# Patient Record
Sex: Female | Born: 1985 | Race: White | Hispanic: No | Marital: Single | Smoking: Never smoker
Health system: Southern US, Community
[De-identification: ages and names within clinical notes are randomized; demographics above are authoritative.]

## PROBLEM LIST (undated history)

## (undated) HISTORY — PX: NO PAST SURGERIES: SHX2092

---

## 2009-01-22 ENCOUNTER — Observation Stay: Payer: Self-pay | Admitting: Obstetrics and Gynecology

## 2009-01-23 ENCOUNTER — Observation Stay: Payer: Self-pay | Admitting: Obstetrics and Gynecology

## 2009-01-24 ENCOUNTER — Observation Stay: Payer: Self-pay | Admitting: Obstetrics and Gynecology

## 2009-01-24 ENCOUNTER — Inpatient Hospital Stay: Payer: Self-pay | Admitting: Obstetrics and Gynecology

## 2014-10-11 ENCOUNTER — Ambulatory Visit: Payer: Self-pay

## 2016-08-15 IMAGING — US US BREAST*R* LIMITED INC AXILLA
1 series · 5 of 5 positions shown · non-contrast
Comparison: None

CLINICAL DATA: Patient presents with palpable nodules in both
breasts, [DATE] to [DATE] on the right and [DATE] to [DATE] on the left.

EXAM:
ULTRASOUND OF THE BILATERAL BREAST

[Series 1: us breast*right* limited inc axilla · 0.08mm/px · 5 of 5 slices shown]
[im 1/5]
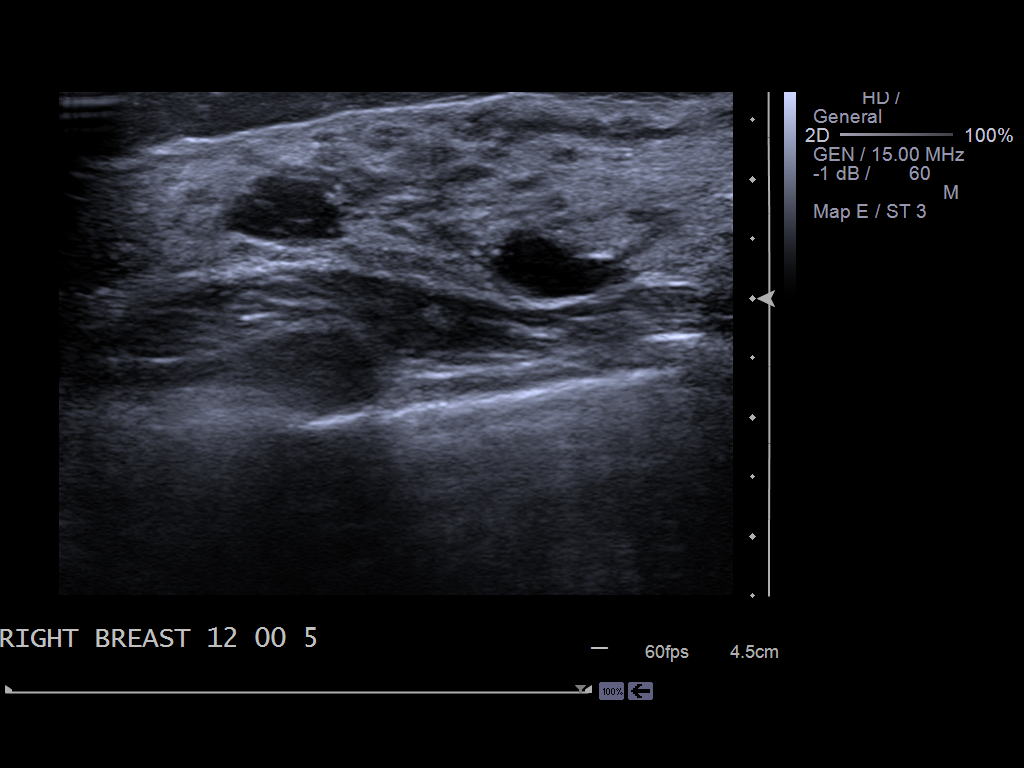
[im 2/5]
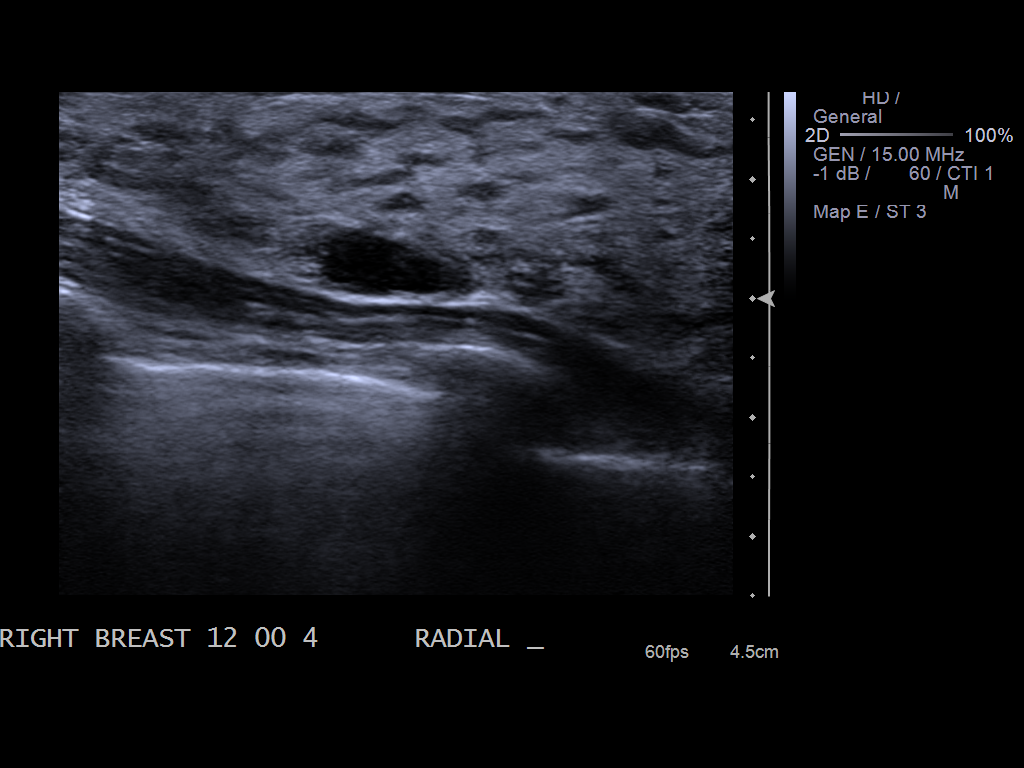
[im 3/5]
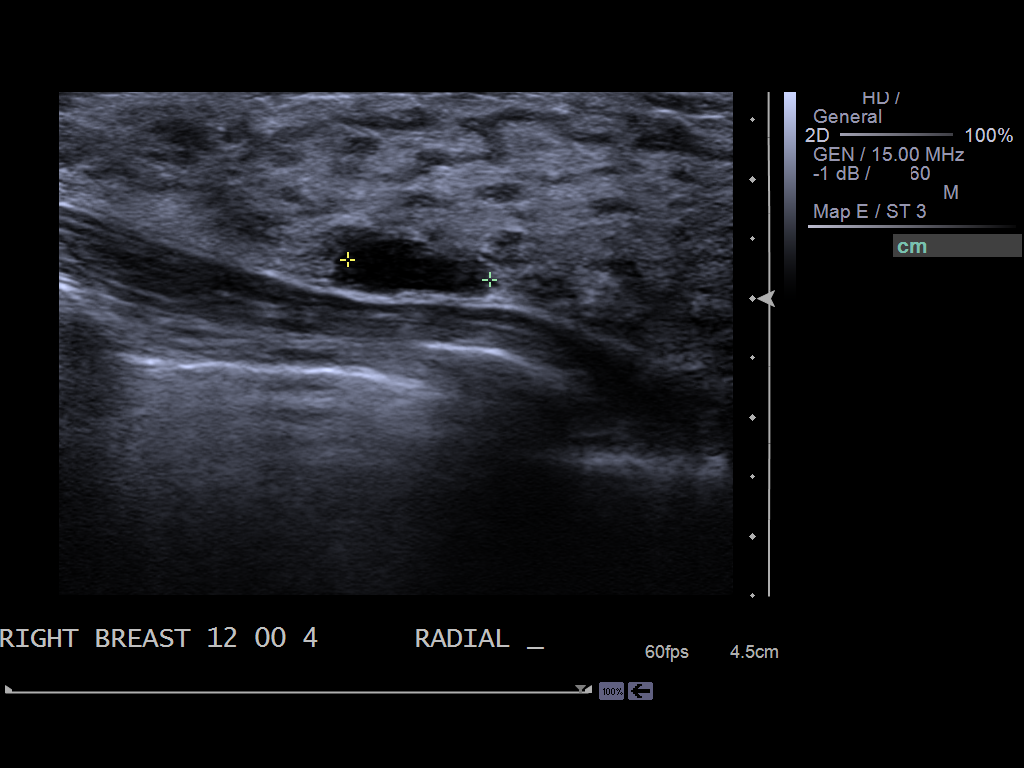
[im 4/5]
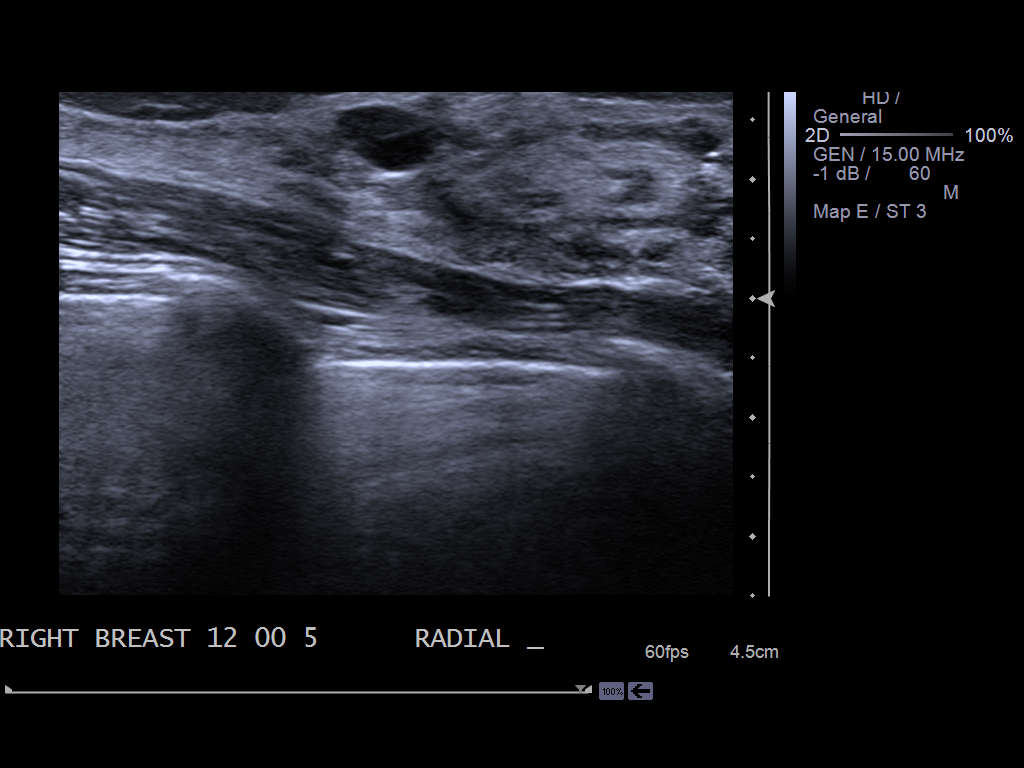
[im 5/5]
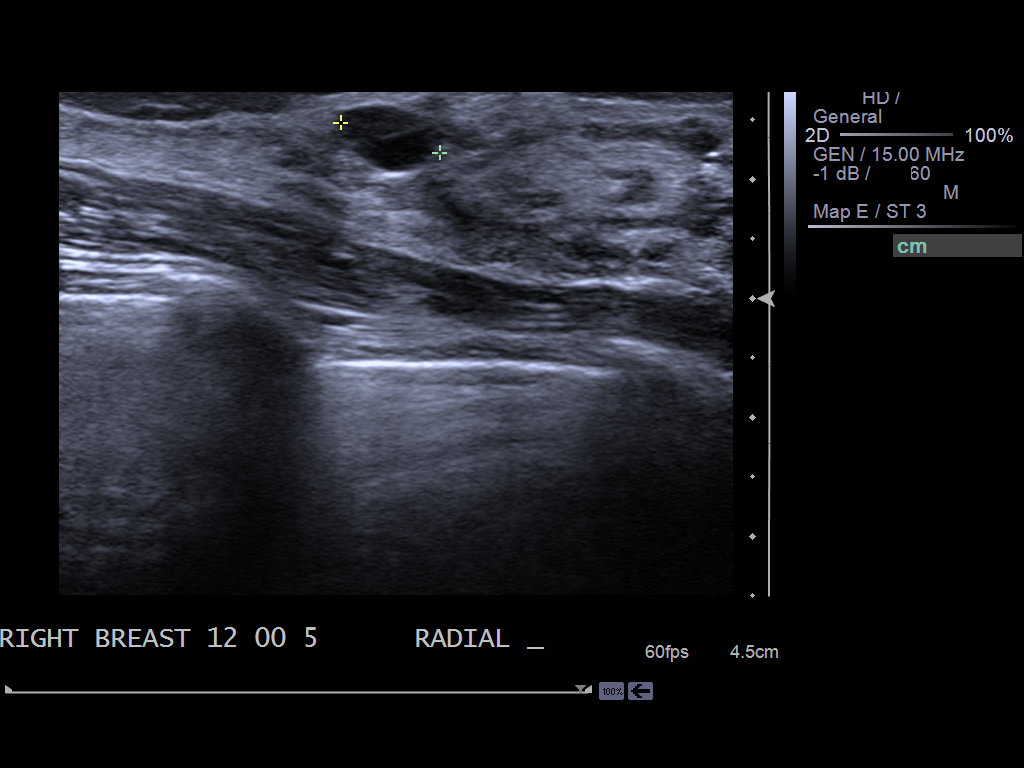

[5 of 5 positions shown; findings below may reference images not displayed]

FINDINGS: On physical exam, there is generalized nodularity of the tissue
throughout the upper aspect of both breasts, more prominent on the
right. No discrete mass is palpated, however.

Targeted ultrasound is performed, showing several small cysts in
each breast. On the right, cysts are larger, with the 2 largest
measuring 1.2 cm and 0.9 cm in greatest dimensions from both in the
12 o'clock position. Two smaller cysts are noted in the [DATE]
position in the left breast. There are no solid masses any the side.
There are no suspicious areas of shadowing.
IMPRESSION: Benign bilateral breast cysts. No solid masses and no evidence of
malignancy.

RECOMMENDATION:
Screening mammogram at age 42 unless there are persistent or
intervening clinical concerns. (Code:[id number]-F-82W)

I have discussed the findings and recommendations with the patient.
Results were also provided in writing at the conclusion of the
visit. If applicable, a reminder letter will be sent to the patient
regarding the next appointment.

BI-RADS CATEGORY  2: Benign Finding(s)

## 2016-08-15 IMAGING — US US BREAST*L* LIMITED INC AXILLA
1 series · 1 of 1 positions shown · non-contrast
Comparison: None

CLINICAL DATA: Patient presents with palpable nodules in both
breasts, [DATE] to [DATE] on the right and [DATE] to [DATE] on the left.

EXAM:
ULTRASOUND OF THE BILATERAL BREAST

[Series 1: us breast*left* limited inc axilla · 0.08mm/px · 1 of 1 slices shown]
[im 1/1]
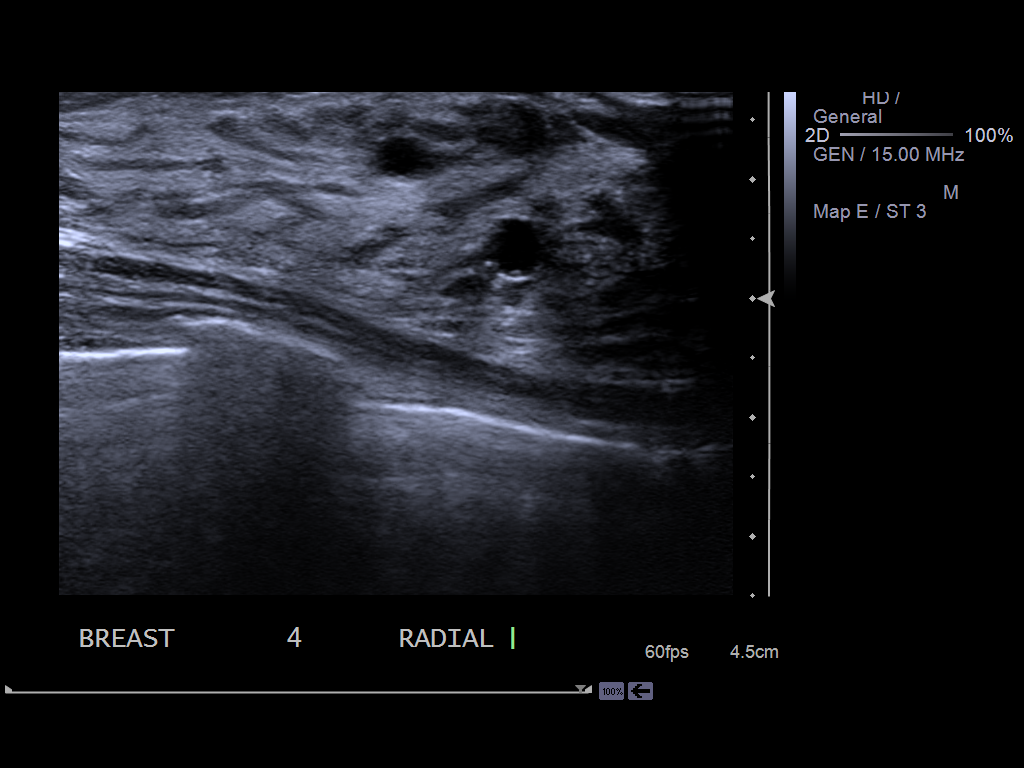

[1 of 1 positions shown; findings below may reference images not displayed]

FINDINGS: On physical exam, there is generalized nodularity of the tissue
throughout the upper aspect of both breasts, more prominent on the
right. No discrete mass is palpated, however.

Targeted ultrasound is performed, showing several small cysts in
each breast. On the right, cysts are larger, with the 2 largest
measuring 1.2 cm and 0.9 cm in greatest dimensions from both in the
12 o'clock position. Two smaller cysts are noted in the [DATE]
position in the left breast. There are no solid masses any the side.
There are no suspicious areas of shadowing.
IMPRESSION: Benign bilateral breast cysts. No solid masses and no evidence of
malignancy.

RECOMMENDATION:
Screening mammogram at age 42 unless there are persistent or
intervening clinical concerns. (Code:W2-F-82W)

I have discussed the findings and recommendations with the patient.
Results were also provided in writing at the conclusion of the
visit. If applicable, a reminder letter will be sent to the patient
regarding the next appointment.

BI-RADS CATEGORY  2: Benign Finding(s)

## 2018-03-14 ENCOUNTER — Encounter: Payer: Self-pay | Admitting: Emergency Medicine

## 2018-03-14 ENCOUNTER — Ambulatory Visit
Admission: EM | Admit: 2018-03-14 | Discharge: 2018-03-14 | Disposition: A | Discharge status: Home / Self Care | Payer: BLUE CROSS/BLUE SHIELD | Attending: Family Medicine | Admitting: Family Medicine

## 2018-03-14 ENCOUNTER — Other Ambulatory Visit: Payer: Self-pay

## 2018-03-14 DIAGNOSIS — R51 Headache: Secondary | ICD-10-CM | POA: Diagnosis not present

## 2018-03-14 DIAGNOSIS — G500 Trigeminal neuralgia: Secondary | ICD-10-CM | POA: Diagnosis not present

## 2018-03-14 DIAGNOSIS — R519 Headache, unspecified: Secondary | ICD-10-CM

## 2018-03-14 DIAGNOSIS — G501 Atypical facial pain: Secondary | ICD-10-CM | POA: Diagnosis not present

## 2018-03-14 MED ORDER — BACLOFEN 5 MG PO TABS: 10.0000 mg | ORAL_TABLET | Freq: Three times a day (TID) | ORAL | 0 refills | Status: DC

## 2018-03-14 MED ORDER — BACLOFEN 5 MG PO TABS: 5.0000 mg | ORAL_TABLET | Freq: Three times a day (TID) | ORAL | 0 refills | Status: DC

## 2018-03-14 NOTE — ED Triage Notes (Signed)
Pt here c/o facial pain on her right side for several months. She thought it was a cavity and went to the dentist. determined to not be a cavity then she went to the orthodontist and was told that this sounded like her sinuses. She has been having sharp pain/pressure in her head and face that last a few seconds and go away. No sinus congestion. denies fevers, chills.

## 2018-03-14 NOTE — ED Provider Notes (Signed)
[facility]    CSN: [id number] Arrival date & time: 03/14/18  1718     History   Chief Complaint Chief Complaint  Patient presents with  . Facial Pain    HPI Heidi Ramos is a 32 y.o. female.   HPI  32 year old female presents with right-sided facial pain that she has had for several months.  She thought it may be a cavity and she went to the dentist.  Dentist a thorough exam finding she had no cavities.  He sent her on to an orthodontist and she told her" more like a sinus problem."  Having sharp pain and pressure in her head and face that lasts a few seconds is very intense and then will go away.  Says any cold food or beverage that she has will precipitate an attack.  She does not have any loss of vision, smell, taste, or decreased hearing, has no weakness of her facial muscles has never had a droop or had any problem with  dysarthria or aphasia.  Had no fever or chills.        History reviewed. No pertinent past medical history.  There are no active problems to display for this patient.   Past Surgical History:  Procedure Laterality Date  . NO PAST SURGERIES      OB History   None      Home Medications    Prior to Admission medications   Medication Sig Start Date End Date Taking? Authorizing Provider  Baclofen 5 MG TABS Take 5 mg by mouth 3 (three) times daily. 03/14/18   Lutricia Feil, PA-C    Family History Family History  Problem Relation Age of Onset  . Hepatitis Mother   . Cancer Father        prostate    Social History Social History   Tobacco Use  . Smoking status: Never Smoker  . Smokeless tobacco: Never Used  Substance Use Topics  . Alcohol use: Never    Frequency: Never  . Drug use: Never     Allergies   Patient has no known allergies.   Review of Systems Review of Systems  Constitutional: Positive for activity change. Negative for appetite change, chills, fatigue and fever.  HENT: Negative for dental  problem, ear discharge, ear pain, hearing loss, sinus pressure, sinus pain and tinnitus.   All other systems reviewed and are negative.    Physical Exam Triage Vital Signs ED Triage Vitals  Enc Vitals Group     BP 03/14/18 1736 97/73     Pulse Rate 03/14/18 1736 84     Resp 03/14/18 1736 16     Temp 03/14/18 1736 98.7 F (37.1 C)     Temp Source 03/14/18 1736 Oral     SpO2 03/14/18 1736 100 %     Weight 03/14/18 1736 100 lb (45.4 kg)     Height 03/14/18 1736 5\' 1"  (1.549 m)     Head Circumference --      Peak Flow --      Pain Score 03/14/18 1734 2     Pain Loc --      Pain Edu? --      Excl. in GC? --    No data found.  Updated Vital Signs BP 97/73 (BP Location: Left Arm)   Pulse 84   Temp 98.7 F (37.1 C) (Oral)   Resp 16   Ht 5\' 1"  (1.549 m)   Wt 100 lb (45.4 kg)  LMP 02/14/2018   SpO2 100%   BMI 18.89 kg/m   Visual Acuity Right Eye Distance:   Left Eye Distance:   Bilateral Distance:    Right Eye Near:   Left Eye Near:    Bilateral Near:     Physical Exam  Constitutional: She is oriented to person, place, and time. She appears well-developed and well-nourished. No distress.  HENT:  Head: Normocephalic.  Right Ear: External ear normal.  Left Ear: External ear normal.  Nose: Nose normal.  Mouth/Throat: Oropharynx is clear and moist. No oropharyngeal exudate.  No TMJ tenderness or pain  Eyes: Pupils are equal, round, and reactive to light. Right eye exhibits no discharge. Left eye exhibits no discharge.  Neck: Normal range of motion. Neck supple.  Musculoskeletal: Normal range of motion.  Lymphadenopathy:    She has no cervical adenopathy.  Neurological: She is alert and oriented to person, place, and time.  Skin: Skin is warm and dry. She is not diaphoretic.  Psychiatric: She has a normal mood and affect. Her behavior is normal. Judgment and thought content normal.  Nursing note and vitals reviewed.    UC Treatments / Results  Labs (all labs  ordered are listed, but only abnormal results are displayed) Labs Reviewed - No data to display  EKG None  Radiology No results found.  Procedures Procedures (including critical care time)  Medications Ordered in UC Medications - No data to display  Initial Impression / Assessment and Plan / UC Course  I have reviewed the triage vital signs and the nursing notes.  Pertinent labs & imaging results that were available during my care of the patient were reviewed by me and considered in my medical decision making (see chart for details).     Plan: 1. Test/x-ray results and diagnosis reviewed with patient 2. rx as per orders; risks, benefits, potential side effects reviewed with patient 3. Recommend supportive treatment with use of baclofen, 5 mg 3 times daily until seen by neurologist. Provided her the name of Dr. Sharen HeckJoffe Nevis. 4. F/u prn if symptoms worsen or don't improve  Final Clinical Impressions(s) / UC Diagnoses   Final diagnoses:  Right sided facial pain  Trigeminal neuralgia of right side of face   Discharge Instructions   None    ED Prescriptions    Medication Sig Dispense Auth. Provider   baclofen 5 MG TABS  (Status: Discontinued) Take 10 mg by mouth 3 (three) times daily. 60 tablet Ovid Curdoemer, Akshara Blumenthal P, PA-C   Baclofen 5 MG TABS Take 5 mg by mouth 3 (three) times daily. 60 tablet Lutricia Feiloemer, Jarren Para P, PA-C     Controlled Substance Prescriptions [place] Controlled Substance Registry consulted? Not Applicable     Prescription  for 10mg  3 times daily was inadvertently released to the pharmacy.  I spoke with pharmacist at the [facility] in [place] and this was rectified.  A new prescription for 5 mg 3 times daily was sent to them which will be given to the patient.   Lutricia FeilRoemer, Akai Dollard P, PA-C 03/14/18 1824

## 2018-09-12 ENCOUNTER — Encounter: Payer: Self-pay | Admitting: Emergency Medicine

## 2018-09-12 ENCOUNTER — Ambulatory Visit
Admission: EM | Admit: 2018-09-12 | Discharge: 2018-09-12 | Disposition: A | Discharge status: Home / Self Care | Payer: Self-pay | Attending: Family Medicine | Admitting: Family Medicine

## 2018-09-12 ENCOUNTER — Other Ambulatory Visit: Payer: Self-pay

## 2018-09-12 DIAGNOSIS — B9789 Other viral agents as the cause of diseases classified elsewhere: Secondary | ICD-10-CM

## 2018-09-12 DIAGNOSIS — J029 Acute pharyngitis, unspecified: Secondary | ICD-10-CM | POA: Insufficient documentation

## 2018-09-12 LAB — RAPID STREP SCREEN (MED CTR MEBANE ONLY): Streptococcus, Group A Screen (Direct): NEGATIVE

## 2018-09-12 MED ORDER — VALACYCLOVIR HCL 1 G PO TABS: 1000.0000 mg | ORAL_TABLET | Freq: Three times a day (TID) | ORAL | 0 refills | Status: AC

## 2018-09-12 NOTE — Discharge Instructions (Addendum)
Take medication as prescribed. Rest. Drink plenty of fluids. Gargles.   Follow up with your primary care physician or Ear Nose Throat this week as needed. Return to Urgent care for new or worsening concerns.

## 2018-09-12 NOTE — ED Provider Notes (Signed)
[facility] ____________________________________________  Time seen: Approximately 5:50 PM  I have reviewed the triage vital signs and the nursing notes.   HISTORY  Chief Complaint Sore Throat   HPI Heidi Ramos is a 33 y.o. female presenting for evaluation of sore throat present for just over 1 week.  States sore throat started off very mild, then worsened and has since plateaued.  States sore throat is currently moderate.  States her throat is the left side and feels and looks like an ulcer.  Reports she had similar sore throat in appearance back in August 2019 that took about 2 weeks to go away but did go away on its own.  Denies any other sores in mouth, fevers, cough, congestion, abdominal pain, chest pain, shortness of breath or other complaints.  Has continued to drink fluids well, has to eat softer foods due to the sore throat.  Denies fevers.  Does not get cold sores.  States her son does get cold sores and currently has one.  No recent oral sex.  Reports otherwise doing well denies other complaints.   History reviewed. No pertinent past medical history.  There are no active problems to display for this patient.   Past Surgical History:  Procedure Laterality Date  . NO PAST SURGERIES       No current facility-administered medications for this encounter.   Current Outpatient Medications:  .  valACYclovir (VALTREX) 1000 MG tablet, Take 1 tablet (1,000 mg total) by mouth 3 (three) times daily for 7 days., Disp: 21 tablet, Rfl: 0  Allergies Patient has no known allergies.  Family History  Problem Relation Age of Onset  . Hepatitis Mother   . Cancer Father        prostate    Social History Social History   Tobacco Use  . Smoking status: Never Smoker  . Smokeless tobacco: Never Used  Substance Use Topics  . Alcohol use: Never    Frequency: Never  . Drug use: Never    Review of Systems Constitutional: No fever ENT: Positive sore  throat. Cardiovascular: Denies chest pain. Respiratory: Denies shortness of breath. Gastrointestinal: No abdominal pain.  No nausea, no vomiting.  No diarrhea.   Musculoskeletal: Negative for back pain. Skin: Negative for rash.   ____________________________________________   PHYSICAL EXAM:  VITAL SIGNS: ED Triage Vitals  Enc Vitals Group     BP 09/12/18 1554 (!) 98/55     Pulse Rate 09/12/18 1554 62     Resp 09/12/18 1554 16     Temp 09/12/18 1554 98.4 F (36.9 C)     Temp Source 09/12/18 1554 Oral     SpO2 09/12/18 1554 97 %     Weight 09/12/18 1551 100 lb (45.4 kg)     Height 09/12/18 1551 5\' 1"  (1.549 m)     Head Circumference --      Peak Flow --      Pain Score 09/12/18 1551 6     Pain Loc --      Pain Edu? --      Excl. in GC? --     Constitutional: Alert and oriented. Well appearing and in no acute distress. Eyes: Conjunctivae are normal. Head: Atraumatic. No sinus tenderness to palpation. No swelling. No erythema.  Ears: no erythema, normal TMs bilaterally.   Nose:No nasal congestion  Mouth/Throat: Mucous membranes are moist. Mild pharyngeal erythema. No tonsillar swelling or exudate.  Just anterior to left tonsillar connection approximate 1 cm area of  erythema with ulcer appearance, no drainage, no edema noted, no other oral lesion noted. Neck: No stridor.  No cervical spine tenderness to palpation. Hematological/Lymphatic/Immunilogical: No cervical lymphadenopathy. Cardiovascular: Normal rate, regular rhythm. Grossly normal heart sounds.  Good peripheral circulation. Respiratory: Normal respiratory effort.  No retractions. No wheezes, rales or rhonchi. Good air movement.  Gastrointestinal: Soft and nontender.  No hepatosplenomegaly noted. Musculoskeletal: Ambulatory with steady gait. No cervical, thoracic or lumbar tenderness to palpation. Neurologic:  Normal speech and language. No gait instability. Skin:  Skin appears warm, dry and intact. No rash  noted. Psychiatric: Mood and affect are normal. Speech and behavior are normal. ___________________________________________   LABS (all labs ordered are listed, but only abnormal results are displayed)  Labs Reviewed  RAPID STREP SCREEN ([facility] ONLY)  CULTURE, GROUP A STREP [facility])  HSV CULTURE AND TYPING   ____________________________________________   PROCEDURES Procedures     INITIAL IMPRESSION / ASSESSMENT AND PLAN / ED COURSE  Pertinent labs & imaging results that were available during my care of the patient were reviewed by me and considered in my medical decision making (see chart for details).  Discussed patient and plan of care with Dr. Judd Gaudier who agrees with plan.  Well-appearing patient.  No acute distress.  Strep negative, will culture.  Discussed evaluation of mono, doubt mono, patient declined at this time.  Suspect viral versus herpetic pharyngitis.  HSV culture obtained.  Encourage rest, fluids, supportive care patient today declines lidocaine viscous gargles.  Will treat with supportive care, over-the-counter gargles and Valtrex.Discussed indication, risks and benefits of medications with patient.  Discussed follow up with Primary care physician this week. Discussed follow up and return parameters including no resolution or any worsening concerns. Patient verbalized understanding and agreed to plan.   ____________________________________________   FINAL CLINICAL IMPRESSION(S) / ED DIAGNOSES  Final diagnoses:  Pharyngitis, unspecified etiology     ED Discharge Orders         Ordered    valACYclovir (VALTREX) 1000 MG tablet  3 times daily     09/12/18 1632           Note: This dictation was prepared with Dragon dictation along with smaller phrase technology. Any transcriptional errors that result from this process are unintentional.         Renford Dills, NP 09/12/18 1753

## 2018-09-12 NOTE — ED Triage Notes (Signed)
Pt c/o sore throat, and headache. She has what she thinks is an ulcer on her throat. Started about a week ago. She does not feel sick.

## 2018-09-14 LAB — HSV CULTURE AND TYPING

## 2018-09-15 ENCOUNTER — Ambulatory Visit
Admission: EM | Admit: 2018-09-15 | Discharge: 2018-09-15 | Disposition: A | Discharge status: Home / Self Care | Payer: Self-pay | Attending: Emergency Medicine | Admitting: Emergency Medicine

## 2018-09-15 ENCOUNTER — Telehealth: Payer: Self-pay | Admitting: Emergency Medicine

## 2018-09-15 LAB — CULTURE, GROUP A STREP (THRC)

## 2018-09-15 NOTE — Telephone Encounter (Signed)
Per lab the clinical staff was given the incorrect swab for a herpes throat culture. Therefore, [employer]. Could not run the test.  This nurse called patient and communicated information.  Patient verbalized understanding and will return to the clinic for the correct swab to be performed.  Patient will not need to be seen but we will need to do a quick reg. And re-order the test. NMW

## 2018-09-15 NOTE — ED Triage Notes (Signed)
Pt here to be re- swabbed for HSV due to lab error.

## 2018-09-18 LAB — HSV CULTURE AND TYPING

## 2020-05-03 ENCOUNTER — Other Ambulatory Visit: Payer: Self-pay

## 2020-05-03 ENCOUNTER — Ambulatory Visit
Admission: EM | Admit: 2020-05-03 | Discharge: 2020-05-03 | Disposition: A | Discharge status: Home / Self Care | Payer: HRSA Program | Attending: Physician Assistant | Admitting: Physician Assistant

## 2020-05-03 ENCOUNTER — Encounter: Payer: Self-pay | Admitting: Emergency Medicine

## 2020-05-03 DIAGNOSIS — Z20822 Contact with and (suspected) exposure to covid-19: Secondary | ICD-10-CM

## 2020-05-03 DIAGNOSIS — B9789 Other viral agents as the cause of diseases classified elsewhere: Secondary | ICD-10-CM | POA: Diagnosis present

## 2020-05-03 DIAGNOSIS — J988 Other specified respiratory disorders: Secondary | ICD-10-CM

## 2020-05-03 LAB — SARS CORONAVIRUS 2 (TAT 6-24 HRS): SARS Coronavirus 2: POSITIVE — AB

## 2020-05-03 NOTE — ED Triage Notes (Signed)
Pt c/o fever (t max 101), cough. Started yesterday. She states her throat feels scratchy but not sore like if she had strep throat.

## 2020-05-03 NOTE — ED Provider Notes (Signed)
[facility]    CSN: [id number] Arrival date & time: 05/03/20  1275      History   Chief Complaint Chief Complaint  Patient presents with   Fever    HPI Heidi Ramos is a 34 y.o. female who presents with onset of scratchy throat  And cough since yesterday am. Last night developed a fever of 100 max. Her cough is non productive. Her boyfriend had URI last week and tested neg for Covid. Denies any URI in the pas 3 weeks.  Has not had Covid injections.     History reviewed. No pertinent past medical history.  There are no problems to display for this patient.   Past Surgical History:  Procedure Laterality Date   NO PAST SURGERIES      OB History   No obstetric history on file.      Home Medications    Prior to Admission medications   Not on File    Family History Family History  Problem Relation Age of Onset   Hepatitis Mother    Cancer Father        prostate    Social History Social History   Tobacco Use   Smoking status: Never Smoker   Smokeless tobacco: Never Used  Building services engineer Use: Never used  Substance Use Topics   Alcohol use: Never   Drug use: Never     Allergies   Patient has no known allergies.   Review of Systems Review of Systems  Constitutional: Positive for appetite change, chills, fatigue and fever. Negative for diaphoresis.  HENT: Positive for postnasal drip. Negative for congestion, ear discharge, ear pain, rhinorrhea, sore throat and trouble swallowing.   Eyes: Negative for discharge.  Respiratory: Positive for cough. Negative for shortness of breath and wheezing.   Cardiovascular: Negative for chest pain.  Gastrointestinal: Negative for abdominal pain, diarrhea, nausea and vomiting.  Genitourinary: Negative for difficulty urinating and dysuria.  Musculoskeletal: Positive for myalgias.  Skin: Negative for rash.  Allergic/Immunologic: Negative for environmental allergies and food allergies.    Neurological: Positive for headaches. Negative for dizziness and light-headedness.  Hematological: Negative for adenopathy.     Physical Exam Triage Vital Signs ED Triage Vitals  Enc Vitals Group     BP 05/03/20 0853 (!) 110/58     Pulse Rate 05/03/20 0853 93     Resp --      Temp 05/03/20 0853 100.1 F (37.8 C)     Temp Source 05/03/20 0853 Oral     SpO2 05/03/20 0853 99 %     Weight 05/03/20 0850 100 lb 1.4 oz (45.4 kg)     Height 05/03/20 0850 5\' 1"  (1.549 m)     Head Circumference --      Peak Flow --      Pain Score 05/03/20 0849 0     Pain Loc --      Pain Edu? --      Excl. in GC? --    No data found.  Updated Vital Signs BP (!) 110/58 (BP Location: Left Arm)    Pulse 93    Temp 100.1 F (37.8 C) (Oral)    Ht 5\' 1"  (1.549 m)    Wt 100 lb 1.4 oz (45.4 kg)    LMP 04/26/2020 (Approximate)    SpO2 99%    BMI 18.91 kg/m   Visual Acuity Right Eye Distance:   Left Eye Distance:   Bilateral Distance:  Right Eye Near:   Left Eye Near:    Bilateral Near:     Physical Exam Physical Exam Vitals signs and nursing note reviewed.  Constitutional:      General: She is not in acute distress.    Appearance: Normal appearance. She is not ill-appearing, toxic-appearing or diaphoretic.  HENT:     Head: Normocephalic.     Right Ear: Tympanic membrane, ear canal and external ear normal.     Left Ear: Tympanic membrane, ear canal and external ear normal.     Nose: Nose normal.     Mouth/Throat:     Mouth: Mucous membranes are moist.  Eyes:     General: No scleral icterus.       Right eye: No discharge.        Left eye: No discharge.     Conjunctiva/sclera: Conjunctivae normal.  Neck:     Musculoskeletal: Neck supple. No neck rigidity.  Cardiovascular:     Rate and Rhythm: Normal rate and regular rhythm.     Heart sounds: No murmur.  Pulmonary:     Effort: Pulmonary effort is normal.     Breath sounds: Normal breath sounds.  Abdominal:     General: Bowel sounds  are normal. There is no distension.     Palpations: Abdomen is soft. There is no mass.     Tenderness: There is no abdominal tenderness. There is no guarding or rebound.     Hernia: No hernia is present.  Musculoskeletal: Normal range of motion.  Lymphadenopathy:     Cervical: No cervical adenopathy.  Skin:    General: Skin is warm and dry.     Coloration: Skin is not jaundiced.     Findings: No rash.  Neurological:     Mental Status: She is alert and oriented to person, place, and time.     Gait: Gait normal.  Psychiatric:        Mood and Affect: Mood normal.        Behavior: Behavior normal.        Thought Content: Thought content normal.        Judgment: Judgment normal.     UC Treatments / Results  Labs (all labs ordered are listed, but only abnormal results are displayed) Labs Reviewed  SARS CORONAVIRUS 2 (TAT 6-24 HRS)    EKG   Radiology No results found.  Procedures Procedures (including critical care time)  Medications Ordered in UC Medications - No data to display  Initial Impression / Assessment and Plan / UC Course  I have reviewed the triage vital signs and the nursing notes. I suspect she has covid. Suportive care advised. See instructions.  If negative and she gets worse needs to return   Final Clinical Impressions(s) / UC Diagnoses   Final diagnoses:  None   Discharge Instructions   None    ED Prescriptions    None     PDMP not reviewed this encounter.   Garey Ham, [place] 05/03/20 [phone]

## 2020-05-03 NOTE — Discharge Instructions (Addendum)
Stay quarantined until you receive your Covid results. Please sign up to Mychart so you can check on them. If your Covid test ends up positive you may take the following supplements to help your immune system be stronger to fight this viral infection Take Quarcetin 500 mg three times a day x 7 days with Zinc 50 mg ones a day x 7 days. The quarcetin is an antiviral and anti-inflammatory supplement which helps open the zinc channels in the cell to absorb Zinc. Zinc helps decrease the virus load in your body. Take Melatonin 6-10 mg at bed time which also helps support your immune system.  Also make sure to take Vit D 5,000 IU per day with a fatty meal and Vit C 1000 mg a day until you are completely better. Stay on Vitamin D 2,000  and C the rest of the season.  Don't lay around, keep active and walk as much as you are able to to prevent worsening of your symptoms.  Follow up with your family Dr next week.  If you get short of breath and you are able to check  your oxygen with a pulse oxygen meter, if it gets to 92% or less, you need to go to the hospital to be admitted. If you dont have one, come back here and we will assess you.

## 2022-12-22 ENCOUNTER — Ambulatory Visit: Payer: Self-pay | Admitting: Nurse Practitioner

## 2022-12-23 ENCOUNTER — Encounter: Payer: Self-pay | Admitting: Nurse Practitioner

## 2022-12-23 ENCOUNTER — Ambulatory Visit: Payer: Self-pay | Admitting: Nurse Practitioner

## 2022-12-23 VITALS — BP 104/68 | HR 73 | Ht 62.0 in | Wt 102.8 lb

## 2022-12-23 DIAGNOSIS — R42 Dizziness and giddiness: Secondary | ICD-10-CM

## 2022-12-23 DIAGNOSIS — R5383 Other fatigue: Secondary | ICD-10-CM

## 2022-12-23 NOTE — Patient Instructions (Signed)
1) Labs today 2) Follow up appt in 1 month, protein shakes daily and no skipping meals

## 2022-12-23 NOTE — Progress Notes (Signed)
New Patient Office Visit  Subjective    Patient ID: Heidi Ramos, female    DOB: October 12, 1985  Age: 37 y.o. MRN: [id number]  CC:  Chief Complaint  Patient presents with   Establish Care    NPE    HPI [name] presents to establish care Pt here today to establish primary care.  Has passed out recently.  Was not aware of environmental stimuli.  Felt warm after passing out.  Her son was vomiting and she was helping him clean up and felt nauseated herself.  Not bleeding heavily.  Not skipping meals.    Outpatient Encounter Medications as of 12/23/2022  Medication Sig   paragard intrauterine copper IUD IUD 1 each by Intrauterine route once.   No facility-administered encounter medications on file as of 12/23/2022.    No past medical history on file.  Past Surgical History:  Procedure Laterality Date   NO PAST SURGERIES      Family History  Problem Relation Age of Onset   Hepatitis Mother    Cancer Father        prostate    Social History   Socioeconomic History   Marital status: Single    Spouse name: Not on file   Number of children: Not on file   Years of education: Not on file   Highest education level: Not on file  Occupational History   Not on file  Tobacco Use   Smoking status: Never   Smokeless tobacco: Never  Vaping Use   Vaping Use: Never used  Substance and Sexual Activity   Alcohol use: Never   Drug use: Never   Sexual activity: Not on file  Other Topics Concern   Not on file  Social History Narrative   Not on file   Social Determinants of Health   Financial Resource Strain: Not on file  Food Insecurity: Not on file  Transportation Needs: Not on file  Physical Activity: Not on file  Stress: Not on file  Social Connections: Not on file  Intimate Partner Violence: Not on file    Review of Systems  Constitutional: Negative.   HENT: Negative.    Eyes: Negative.   Respiratory: Negative.    Cardiovascular: Negative.    Gastrointestinal: Negative.   Genitourinary: Negative.   Musculoskeletal: Negative.   Skin: Negative.   Neurological: Negative.   Endo/Heme/Allergies: Negative.   Psychiatric/Behavioral: Negative.          Objective    BP 104/68   Pulse 73   Ht 5\' 2"  (1.575 m)   Wt 102 lb 12.8 oz (46.6 kg)   SpO2 100%   BMI 18.80 kg/m   Physical Exam Vitals reviewed.  Constitutional:      Appearance: Normal appearance.  HENT:     Head: Normocephalic.     Nose: Nose normal.     Mouth/Throat:     Mouth: Mucous membranes are moist.  Eyes:     Pupils: Pupils are equal, round, and reactive to light.  Cardiovascular:     Rate and Rhythm: Normal rate and regular rhythm.  Pulmonary:     Effort: Pulmonary effort is normal.     Breath sounds: Normal breath sounds.  Abdominal:     General: Bowel sounds are normal.     Palpations: Abdomen is soft.  Musculoskeletal:        General: Normal range of motion.     Cervical back: Normal range of motion and neck supple.  Skin:  General: Skin is warm and dry.  Neurological:     Mental Status: She is alert and oriented to person, place, and time.  Psychiatric:        Mood and Affect: Mood normal.        Behavior: Behavior normal.         Assessment & Plan:   Problem List Items Addressed This Visit       Other   Dizziness - Primary   Other fatigue   Relevant Orders   CMP14+EGFR   Thyroid Profile   CBC with Diff    No follow-ups on file.   Orson Eva, NP

## 2022-12-24 LAB — CBC WITH DIFFERENTIAL/PLATELET
Basophils Absolute: 0 10*3/uL (ref 0.0–0.2)
Basos: 0 %
EOS (ABSOLUTE): 0.2 10*3/uL (ref 0.0–0.4)
Eos: 3 %
Hematocrit: 38.7 % (ref 34.0–46.6)
Hemoglobin: 12.5 g/dL (ref 11.1–15.9)
Immature Grans (Abs): 0 10*3/uL (ref 0.0–0.1)
Immature Granulocytes: 0 %
Lymphocytes Absolute: 1.7 10*3/uL (ref 0.7–3.1)
Lymphs: 30 %
MCH: 29.6 pg (ref 26.6–33.0)
MCHC: 32.3 g/dL (ref 31.5–35.7)
MCV: 92 fL (ref 79–97)
Monocytes Absolute: 0.3 10*3/uL (ref 0.1–0.9)
Monocytes: 6 %
Neutrophils Absolute: 3.5 10*3/uL (ref 1.4–7.0)
Neutrophils: 61 %
Platelets: 269 10*3/uL (ref 150–450)
RBC: 4.23 x10E6/uL (ref 3.77–5.28)
RDW: 12.3 % (ref 11.7–15.4)
WBC: 5.6 10*3/uL (ref 3.4–10.8)

## 2022-12-24 LAB — CMP14+EGFR
ALT: 5 IU/L (ref 0–32)
AST: 13 IU/L (ref 0–40)
Albumin/Globulin Ratio: 1.5 (ref 1.2–2.2)
Albumin: 4.3 g/dL (ref 3.9–4.9)
Alkaline Phosphatase: 69 IU/L (ref 44–121)
BUN/Creatinine Ratio: 16 (ref 9–23)
BUN: 12 mg/dL (ref 6–20)
Bilirubin Total: 0.3 mg/dL (ref 0.0–1.2)
CO2: 20 mmol/L (ref 20–29)
Calcium: 8.8 mg/dL (ref 8.7–10.2)
Chloride: 103 mmol/L (ref 96–106)
Creatinine, Ser: 0.76 mg/dL (ref 0.57–1.00)
Globulin, Total: 2.9 g/dL (ref 1.5–4.5)
Glucose: 85 mg/dL (ref 70–99)
Potassium: 4.4 mmol/L (ref 3.5–5.2)
Sodium: 137 mmol/L (ref 134–144)
Total Protein: 7.2 g/dL (ref 6.0–8.5)
eGFR: 105 mL/min/{1.73_m2} (ref >59)

## 2022-12-24 LAB — THYROID PANEL
Free Thyroxine Index: 1.8 (ref 1.2–4.9)
T3 Uptake Ratio: 23 % — ABNORMAL LOW (ref 24–39)
T4, Total: 7.9 ug/dL (ref 4.5–12.0)

## 2023-01-25 ENCOUNTER — Ambulatory Visit: Payer: Self-pay | Admitting: Nurse Practitioner

## 2023-01-27 ENCOUNTER — Ambulatory Visit: Payer: Self-pay | Admitting: Nurse Practitioner

## 2023-02-05 ENCOUNTER — Ambulatory Visit: Payer: Self-pay | Admitting: Nurse Practitioner

## 2023-02-23 ENCOUNTER — Ambulatory Visit: Payer: Self-pay | Admitting: Cardiology

## 2023-03-30 NOTE — Progress Notes (Signed)
Error incounter

## 2023-06-28 ENCOUNTER — Ambulatory Visit: Payer: Self-pay | Admitting: Cardiology

## 2023-06-28 ENCOUNTER — Encounter: Payer: Self-pay | Admitting: Cardiology

## 2023-06-28 VITALS — BP 96/68 | HR 67 | Ht 61.0 in | Wt 104.2 lb

## 2023-06-28 DIAGNOSIS — Z13220 Encounter for screening for lipoid disorders: Secondary | ICD-10-CM

## 2023-06-28 DIAGNOSIS — Z1329 Encounter for screening for other suspected endocrine disorder: Secondary | ICD-10-CM | POA: Insufficient documentation

## 2023-06-28 DIAGNOSIS — Z131 Encounter for screening for diabetes mellitus: Secondary | ICD-10-CM

## 2023-06-28 NOTE — Progress Notes (Signed)
Established Patient Office Visit  Subjective:  Patient ID: Heidi Ramos, female    DOB: 11-11-1985  Age: 37 y.o. MRN: [id number]  Chief Complaint  Patient presents with   Follow-up    6 month follow up    Patient in office for 6 month follow up. Patient doing well, no complaints today. No further episodes of syncope.  Patient not fasting today. Will return for fasting lab work and will call with results.  GYN does pap smears.     No other concerns at this time.   History reviewed. No pertinent past medical history.  Past Surgical History:  Procedure Laterality Date   NO PAST SURGERIES      Social History   Socioeconomic History   Marital status: Single    Spouse name: Not on file   Number of children: Not on file   Years of education: Not on file   Highest education level: Not on file  Occupational History   Not on file  Tobacco Use   Smoking status: Never   Smokeless tobacco: Never  Vaping Use   Vaping status: Never Used  Substance and Sexual Activity   Alcohol use: Never   Drug use: Never   Sexual activity: Not on file  Other Topics Concern   Not on file  Social History Narrative   Not on file   Social Determinants of Health   Financial Resource Strain: Not on file  Food Insecurity: Not on file  Transportation Needs: Not on file  Physical Activity: Not on file  Stress: Not on file  Social Connections: Not on file  Intimate Partner Violence: Not on file    Family History  Problem Relation Age of Onset   Hepatitis Mother    Cancer Father        prostate    No Known Allergies  Review of Systems  Constitutional: Negative.   HENT: Negative.    Eyes: Negative.   Respiratory: Negative.  Negative for shortness of breath.   Cardiovascular: Negative.  Negative for chest pain.  Gastrointestinal: Negative.  Negative for abdominal pain, constipation and diarrhea.  Genitourinary: Negative.   Musculoskeletal:  Negative for joint pain and myalgias.   Skin: Negative.   Neurological: Negative.  Negative for dizziness and headaches.  Endo/Heme/Allergies: Negative.   All other systems reviewed and are negative.      Objective:   BP 96/68   Pulse 67   Ht 5\' 1"  (1.549 m)   Wt 104 lb 3.2 oz (47.3 kg)   SpO2 97%   BMI 19.69 kg/m   Vitals:   06/28/23 1024  BP: 96/68  Pulse: 67  Height: 5\' 1"  (1.549 m)  Weight: 104 lb 3.2 oz (47.3 kg)  SpO2: 97%  BMI (Calculated): 19.7    Physical Exam Vitals and nursing note reviewed.  Constitutional:      Appearance: Normal appearance. She is normal weight.  HENT:     Head: Normocephalic and atraumatic.     Nose: Nose normal.     Mouth/Throat:     Mouth: Mucous membranes are moist.  Eyes:     Extraocular Movements: Extraocular movements intact.     Conjunctiva/sclera: Conjunctivae normal.     Pupils: Pupils are equal, round, and reactive to light.  Cardiovascular:     Rate and Rhythm: Normal rate and regular rhythm.     Pulses: Normal pulses.     Heart sounds: Normal heart sounds.  Pulmonary:     Effort:  Pulmonary effort is normal.     Breath sounds: Normal breath sounds.  Abdominal:     General: Abdomen is flat. Bowel sounds are normal.     Palpations: Abdomen is soft.  Musculoskeletal:        General: Normal range of motion.     Cervical back: Normal range of motion.  Skin:    General: Skin is warm and dry.  Neurological:     General: No focal deficit present.     Mental Status: She is alert and oriented to person, place, and time.  Psychiatric:        Mood and Affect: Mood normal.        Behavior: Behavior normal.        Thought Content: Thought content normal.        Judgment: Judgment normal.      No results found for any visits on 06/28/23.  No results found for this or any previous visit (from the past 2160 hour(s)).    Assessment & Plan:  Return for fasting lab work.   Problem List Items Addressed This Visit       Other   Thyroid disorder screening  - Primary   Relevant Orders   TSH    Return in about 1 year (around 06/27/2024).   Total time spent: 25 minutes  [employer], NP  06/28/2023   This document may have been prepared by Dragon Voice Recognition software and as such may include unintentional dictation errors.

## 2024-06-27 ENCOUNTER — Ambulatory Visit: Payer: Self-pay | Admitting: Cardiology
